# Patient Record
Sex: Male | Born: 1960
Health system: Southern US, Community
[De-identification: ages and names within clinical notes are randomized; demographics above are authoritative.]

## PROBLEM LIST (undated history)

## (undated) DIAGNOSIS — T7840XA Allergy, unspecified, initial encounter: Secondary | ICD-10-CM

## (undated) DIAGNOSIS — I480 Paroxysmal atrial fibrillation: Secondary | ICD-10-CM

## (undated) HISTORY — PX: FOOT SURGERY: SHX648

## (undated) HISTORY — PX: COLONOSCOPY: SHX174

## (undated) HISTORY — DX: Allergy, unspecified, initial encounter: T78.40XA

## (undated) HISTORY — DX: Paroxysmal atrial fibrillation: I48.0

## (undated) HISTORY — PX: APPENDECTOMY: SHX54

---

## 2001-04-01 ENCOUNTER — Emergency Department (HOSPITAL_COMMUNITY): Admission: EM | Admit: 2001-04-01 | Discharge: 2001-04-01 | Payer: Self-pay | Admitting: Emergency Medicine

## 2010-04-21 ENCOUNTER — Encounter: Payer: Self-pay | Admitting: Internal Medicine

## 2010-04-27 NOTE — Letter (Signed)
Summary: Pre Visit Letter Revised  [facility]  [address] [place], [place] [address]   Phone: [phone]  Fax: [phone]        04/21/2010 MRN: [id number]  Casey Grant [address] [place], [place]  [address]             Procedure Date:  06-11-10 11:30am            Dr Marina Goodell    Direct Colon   Welcome to the Gastroenterology Division at [facility].    You are scheduled to see a nurse for your pre-procedure visit on 05-24-10  at 4pm on the 3rd floor at [facility], [address]  We ask that you try to arrive at our office 15 minutes prior to your appointment time to allow for check-in.  Please take a minute to review the attached form.  If you answer "Yes" to one or more of the questions on the first page, we ask that you call the person listed at your earliest opportunity.  If you answer "No" to all of the questions, please complete the rest of the form and bring it to your appointment.    Your nurse visit will consist of discussing your medical and surgical history, your immediate family medical history, and your medications.   If you are unable to list all of your medications on the form, please bring the medication bottles to your appointment and we will list them.  We will need to be aware of both prescribed and over the counter drugs.  We will need to know exact dosage information as well.    Please be prepared to read and sign documents such as consent forms, a financial agreement, and acknowledgement forms.  If necessary, and with your consent, a friend or relative is welcome to sit-in on the nurse visit with you.  Please bring your insurance card so that we may make a copy of it.  If your insurance requires a referral to see a specialist, please bring your referral form from your primary care physician.  No co-pay is required for this nurse visit.     If you cannot keep your appointment, please call [phone] to cancel or reschedule prior  to your appointment date.  This allows [place] the opportunity to schedule an appointment for another patient in need of care.    Thank you for choosing [facility] for your medical needs.  We appreciate the opportunity to care for you.  Please visit [place] at our website  to learn more about our practice.  Sincerely, The Gastroenterology Division

## 2010-05-24 ENCOUNTER — Ambulatory Visit (AMBULATORY_SURGERY_CENTER): Payer: 59 | Admitting: *Deleted

## 2010-05-24 VITALS — Ht 71.0 in | Wt 175.2 lb

## 2010-05-24 DIAGNOSIS — Z1211 Encounter for screening for malignant neoplasm of colon: Secondary | ICD-10-CM

## 2010-05-24 MED ORDER — PEG-KCL-NACL-NASULF-NA ASC-C 100 G PO SOLR: ORAL | Status: DC

## 2010-06-11 ENCOUNTER — Other Ambulatory Visit: Payer: Self-pay | Admitting: Internal Medicine

## 2010-06-25 ENCOUNTER — Other Ambulatory Visit: Payer: 59 | Admitting: Internal Medicine

## 2010-07-02 ENCOUNTER — Encounter: Payer: Self-pay | Admitting: Internal Medicine

## 2010-07-02 ENCOUNTER — Ambulatory Visit (AMBULATORY_SURGERY_CENTER): Payer: 59 | Admitting: Internal Medicine

## 2010-07-02 VITALS — BP 114/65 | HR 40 | Temp 96.5°F | Resp 12 | Ht 71.0 in | Wt 168.0 lb

## 2010-07-02 DIAGNOSIS — Z1211 Encounter for screening for malignant neoplasm of colon: Secondary | ICD-10-CM

## 2010-07-02 MED ORDER — SODIUM CHLORIDE 0.9 % IV SOLN: 500.0000 mL | INTRAVENOUS | Status: AC

## 2010-07-02 NOTE — Patient Instructions (Signed)
Discharged instructions given with verbal understanding. Normal examination. Resume previous medications. 

## 2010-07-05 ENCOUNTER — Telehealth: Payer: Self-pay | Admitting: *Deleted

## 2010-07-05 NOTE — Telephone Encounter (Signed)
Unable to leave message due to no identification on the cell number/ home number left-[id number] [id number]

## 2017-03-16 ENCOUNTER — Ambulatory Visit: Payer: 59 | Admitting: Physician Assistant

## 2017-03-16 ENCOUNTER — Encounter: Payer: Self-pay | Admitting: Physician Assistant

## 2017-03-16 VITALS — BP 116/76 | HR 51 | Temp 97.3°F | Resp 16 | Ht 71.0 in | Wt 176.0 lb

## 2017-03-16 DIAGNOSIS — L089 Local infection of the skin and subcutaneous tissue, unspecified: Secondary | ICD-10-CM | POA: Diagnosis not present

## 2017-03-16 MED ORDER — CEPHALEXIN 500 MG PO CAPS: 500.0000 mg | ORAL_CAPSULE | Freq: Three times a day (TID) | ORAL | 0 refills | Status: AC

## 2017-03-16 NOTE — Progress Notes (Signed)
PRIMARY CARE AT [facility] [address] [place] [address] [phone]  Date:  03/16/2017   Name:  ZELL HYLTON   DOB:  06/08/1960   MRN:  [id number]  PCP:  Jacqulyn Cane, MD    History of Present Illness:  JAHAD OLD is a 57 y.o. male patient who presents to PCP with  Chief Complaint  Patient presents with  . bump on finger    possible infection/ x 1 month, puus and blood     Patient is here with bump on his finger for 1 month.  He has had 2 occasions where he has attempted to drain the lesion.  He reports that pus came out of the lesion.  The last time he did this was yesterday.  He notes no fever.  No trouble with using the finger.  He is right hand dominant.    There are no active problems to display for this patient.   Past Medical History:  Diagnosis Date  . Allergy     Past Surgical History:  Procedure Laterality Date  . APPENDECTOMY      Social History   Tobacco Use  . Smoking status: Never Smoker  Substance Use Topics  . Alcohol use: No  . Drug use: No    History reviewed. No pertinent family history.  No Known Allergies  Medication list has been reviewed and updated.  Current Outpatient Medications on File Prior to Visit  Medication Sig Dispense Refill  . peg 3350 powder (MOVIPREP) 100 G SOLR MOVIPREP AS DIRECTED (Patient not taking: Reported on 03/16/2017) 1 kit 0   Current Facility-Administered Medications on File Prior to Visit  Medication Dose Route Frequency Provider Last Rate Last Dose  . 0.9 %  sodium chloride infusion  500 mL Intravenous Continuous Irene Shipper, MD        ROS ROS otherwise unremarkable unless listed above.  Physical Examination: BP 116/76   Pulse (!) 51   Temp (!) 97.3 F (36.3 C) (Oral)   Resp 16   Ht '5\' 11"'  (1.803 m)   Wt 176 lb (79.8 kg)   SpO2 100%   BMI 24.55 kg/m  Ideal Body Weight: Weight in (lb) to have BMI = 25: 178.9  Physical Exam  Constitutional: He is oriented to person, place, and time.  He appears well-developed and well-nourished. No distress.  HENT:  Head: Normocephalic and atraumatic.  Eyes: Conjunctivae and EOM are normal. Pupils are equal, round, and reactive to light.  Cardiovascular: Normal rate.  Pulmonary/Chest: Effort normal. No respiratory distress.  Neurological: He is alert and oriented to person, place, and time.  Skin: Skin is warm and dry. Capillary refill takes less than 2 seconds. He is not diaphoretic.  Dorsum of 5th finger distally, is a mildly tender nodule with abrasion puncture marks consistent with his report of draining lesion.   Psychiatric: He has a normal mood and affect. His behavior is normal.   Procedure: verbal consent obtained.  Cleansed with povidine.  1% lidocaine placed at the localized lesion of 5th finger dorsum of distal area.  11 blade utilized to make simple incision.  No drainage removed at this time, but sanguinous material.  Searched with 2 small .3cm incisions.  Cleansed with saline and dressing placed.  Assessment and Plan: DERRIK MCEACHERN is a 57 y.o. male who is here today for cc of  Chief Complaint  Patient presents with  . bump on finger    possible infection/ x 1  month, puus and blood  keflex given today.  Advised alarming symptoms to warrant immediate return.   Tetanus utd.  Wound care discussed.  Infection of finger - Plan: cephALEXin (KEFLEX) 500 MG capsule  Ivar Drape, PA-C [facility] 1/31/20192:54 PM

## 2017-03-16 NOTE — Patient Instructions (Addendum)
Please soak the finger three times per day for 15 minutes.  This can be warm soaks in water, or with soapy water, or 1/3 of hydrogen peroxide WITH the water.   Keep bandaged. Do not submerge the finger in soiled water.  Do not mess with debris or dirt with the finger.  If you do, immediate wash this.     IF you received an x-ray today, you will receive an invoice from [facility]. Please contact [facility] at [phone] with questions or concerns regarding your invoice.   IF you received labwork today, you will receive an invoice from [facility]. Please contact [facility] at [phone] with questions or concerns regarding your invoice.   Our billing staff will not be able to assist you with questions regarding bills from these companies.  You will be contacted with the lab results as soon as they are available. The fastest way to get your results is to activate your My Chart account. Instructions are located on the last page of this paperwork. If you have not heard from [place] regarding the results in 2 weeks, please contact this office.

## 2017-05-15 ENCOUNTER — Encounter: Payer: Self-pay | Admitting: Physician Assistant

## 2017-08-30 ENCOUNTER — Ambulatory Visit: Payer: 59 | Admitting: Physician Assistant

## 2017-08-30 ENCOUNTER — Other Ambulatory Visit: Payer: Self-pay

## 2017-08-30 ENCOUNTER — Encounter: Payer: Self-pay | Admitting: Physician Assistant

## 2017-08-30 VITALS — BP 143/79 | HR 50 | Temp 97.4°F | Ht 69.96 in | Wt 170.8 lb

## 2017-08-30 DIAGNOSIS — Z23 Encounter for immunization: Secondary | ICD-10-CM

## 2017-08-30 DIAGNOSIS — S61412A Laceration without foreign body of left hand, initial encounter: Secondary | ICD-10-CM | POA: Diagnosis not present

## 2017-08-30 NOTE — Progress Notes (Signed)
  08/30/2017 12:15 PM   DOB: 29-Oct-1960 / MRN: [id number]  SUBJECTIVE:  Casey Grant is a 57 y.o. male presenting for a laceration that occurred last night. Denies loss of strength, change in function and sensation. Washed the wound and bandaged.  Works at the [employer].  No allergies. Can't remember last TD.    There is no immunization history for the selected administration types on file for this patient.  He has No Known Allergies.   Review of Systems  Constitutional: Negative for chills, diaphoresis and fever.  Gastrointestinal: Negative for nausea.  Skin: Negative for rash.  Neurological: Negative for dizziness, sensory change and focal weakness.  Endo/Heme/Allergies: Does not bruise/bleed easily.    The problem list and medications were reviewed and updated by myself where necessary and exist elsewhere in the encounter.   OBJECTIVE:  BP (!) 143/79 (BP Location: Right Arm, Patient Position: Sitting, Cuff Size: Normal)   Pulse (!) 50   Temp (!) 97.4 F (36.3 C) (Oral)   Ht 5' 9.96" (1.777 m)   Wt 170 lb 12.8 oz (77.5 kg)   SpO2 100%   BMI 24.53 kg/m   Physical Exam  Constitutional: He is oriented to person, place, and time. He appears well-developed. He does not appear ill.  Eyes: Pupils are equal, round, and reactive to light. Conjunctivae and EOM are normal.  Cardiovascular: Normal rate.  Pulmonary/Chest: Effort normal.  Abdominal: He exhibits no distension.  Musculoskeletal: Normal range of motion.       Hands: Neurological: He is alert and oriented to person, place, and time. No cranial nerve deficit. Coordination normal.  Skin: Skin is warm and dry. He is not diaphoretic.  Psychiatric: He has a normal mood and affect.  Nursing note and vitals reviewed.   Risk and benefits discussed and verbal consent obtained. Anesthetic allergies reviewed. Patient anesthetized using 1:1 mix of 2% lidocaine with epi and Marcaine. The wound was cleansed thoroughly with  soap and water. Sterile prep and drape. Wound closed with 5 HM throws using 5-0 Ethilon suture material. Hemostasis achieved. Mupirocin applied to the wound and bandage placed. The patient tolerated well. Wound instructions were provided and the patient is to return in 10 days for suture removal.   ASSESSMENT AND PLAN:  Casey Grant was seen today for laceration.  Diagnoses and all orders for this visit:  Laceration of left hand without foreign body, initial encounter  Need for tetanus booster -     Td vaccine greater than or equal to 7yo preservative free IM    The patient is advised to call or return to clinic if he does not see an improvement in symptoms, or to seek the care of the closest emergency department if he worsens with the above plan.   Philis Fendt, MHS, PA-C [facility] 08/30/2017 12:15 PM

## 2017-08-30 NOTE — Patient Instructions (Addendum)
WOUND CARE Please return in 10 days to have your stitches/staples removed or sooner if you have concerns. . Keep area clean and dry for 24 hours. Do not remove bandage, if applied. . After 24 hours, remove bandage and wash wound gently with mild soap and warm water. Reapply a new bandage after cleaning wound, if directed. . Continue daily cleansing with soap and water until stitches/staples are removed. . Do not apply any ointments or creams to the wound while stitches/staples are in place, as this may cause delayed healing. . Notify the office if you experience any of the following signs of infection: Swelling, redness, pus drainage, streaking, fever >101.0 F . Notify the office if you experience excessive bleeding that does not stop after 15-20 minutes of constant, firm pressure.      IF you received an x-ray today, you will receive an invoice from [facility]. Please contact [facility] at [phone] with questions or concerns regarding your invoice.   IF you received labwork today, you will receive an invoice from [facility]. Please contact [facility] at [phone] with questions or concerns regarding your invoice.   Our billing staff will not be able to assist you with questions regarding bills from these companies.  You will be contacted with the lab results as soon as they are available. The fastest way to get your results is to activate your My Chart account. Instructions are located on the last page of this paperwork. If you have not heard from us regarding the results in 2 weeks, please contact this office.      

## 2017-09-11 ENCOUNTER — Ambulatory Visit: Payer: 59 | Admitting: Physician Assistant

## 2017-09-11 ENCOUNTER — Encounter: Payer: Self-pay | Admitting: Physician Assistant

## 2017-09-11 ENCOUNTER — Other Ambulatory Visit: Payer: Self-pay

## 2017-09-11 VITALS — BP 129/76 | HR 44 | Temp 97.4°F | Resp 16 | Ht 69.96 in | Wt 169.8 lb

## 2017-09-11 DIAGNOSIS — S61412A Laceration without foreign body of left hand, initial encounter: Secondary | ICD-10-CM

## 2017-09-11 NOTE — Patient Instructions (Signed)
     IF you received an x-ray today, you will receive an invoice from [facility]. Please contact [facility] at [phone] with questions or concerns regarding your invoice.   IF you received labwork today, you will receive an invoice from [facility]. Please contact [facility] at [phone] with questions or concerns regarding your invoice.   Our billing staff will not be able to assist you with questions regarding bills from these companies.  You will be contacted with the lab results as soon as they are available. The fastest way to get your results is to activate your My Chart account. Instructions are located on the last page of this paperwork. If you have not heard from us regarding the results in 2 weeks, please contact this office.     

## 2017-09-11 NOTE — Progress Notes (Signed)
4 sutures removed from the left hand without difficulty.  No complications.  No charge visit.

## 2019-03-01 ENCOUNTER — Other Ambulatory Visit: Payer: Self-pay | Admitting: Nurse Practitioner

## 2019-03-01 ENCOUNTER — Ambulatory Visit
Admission: RE | Admit: 2019-03-01 | Discharge: 2019-03-01 | Disposition: A | Discharge status: Home / Self Care | Payer: No Typology Code available for payment source | Source: Ambulatory Visit | Attending: Nurse Practitioner | Admitting: Nurse Practitioner

## 2019-03-01 DIAGNOSIS — Z0000 Encounter for general adult medical examination without abnormal findings: Secondary | ICD-10-CM

## 2019-10-30 ENCOUNTER — Other Ambulatory Visit: Payer: Self-pay

## 2019-10-30 ENCOUNTER — Ambulatory Visit: Payer: 59 | Admitting: Podiatry

## 2019-10-30 ENCOUNTER — Ambulatory Visit (INDEPENDENT_AMBULATORY_CARE_PROVIDER_SITE_OTHER): Payer: 59

## 2019-10-30 DIAGNOSIS — M25871 Other specified joint disorders, right ankle and foot: Secondary | ICD-10-CM | POA: Diagnosis not present

## 2019-10-30 DIAGNOSIS — M2041 Other hammer toe(s) (acquired), right foot: Secondary | ICD-10-CM | POA: Diagnosis not present

## 2019-10-30 DIAGNOSIS — S9031XA Contusion of right foot, initial encounter: Secondary | ICD-10-CM | POA: Diagnosis not present

## 2019-10-30 DIAGNOSIS — Z01818 Encounter for other preprocedural examination: Secondary | ICD-10-CM

## 2019-10-30 DIAGNOSIS — M79671 Pain in right foot: Secondary | ICD-10-CM | POA: Diagnosis not present

## 2019-10-31 ENCOUNTER — Other Ambulatory Visit: Payer: Self-pay | Admitting: Podiatry

## 2019-10-31 DIAGNOSIS — S9031XA Contusion of right foot, initial encounter: Secondary | ICD-10-CM

## 2019-11-01 ENCOUNTER — Encounter: Payer: Self-pay | Admitting: Podiatry

## 2019-11-01 NOTE — Progress Notes (Signed)
Subjective:  Patient ID: Casey Grant, male    DOB: 1960-03-12,  MRN: [id number]  Chief Complaint  Patient presents with  . Foot Pain    Right foot pain PT stated that he has some swelling underneath his toes, He has been to DR.TOMS and got a injection which helped for a few months but pain came back    59 y.o. male presents with the above complaint.  Patient presents with complaint of right second metatarsophalangeal joint pain.  Patient states that he has been treated with conservative treatment options by Dr. Gershon Mussel in which he got injections multiple times which did tend to help but his pain is coming back more often.  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  He has tried all the conservative treatment options but has failed them all.  He would like to discuss treatment with surgical treatment options at this time.  He is a runner and he would like to continue running.  Patient states that the right second metatarsophalangeal joint like it sticks locating on the MPJ with every step.  He can feel the crepitus with pain.  He denies any other acute complaints.  He would like to discuss surgical treatment options at this time.   Review of Systems: Negative except as noted in the HPI. Denies N/V/F/Ch.  Past Medical History:  Diagnosis Date  . Allergy     Current Outpatient Medications:  .  peg 3350 powder (MOVIPREP) 100 G SOLR, MOVIPREP AS DIRECTED (Patient not taking: Reported on 03/16/2017), Disp: 1 kit, Rfl: 0  Current Facility-Administered Medications:  .  0.9 %  sodium chloride infusion, 500 mL, Intravenous, Continuous, Irene Shipper, MD  Social History   Tobacco Use  Smoking Status Never Smoker  Smokeless Tobacco Never Used    No Known Allergies Objective:  There were no vitals filed for this visit. There is no height or weight on file to calculate BMI. Constitutional Well developed. Well nourished.  Vascular Dorsalis pedis pulses palpable bilaterally. Posterior tibial  pulses palpable bilaterally. Capillary refill normal to all digits.  No cyanosis or clubbing noted. Pedal hair growth normal.  Neurologic Normal speech. Oriented to person, place, and time. Epicritic sensation to light touch grossly present bilaterally.  Dermatologic Nails well groomed and normal in appearance. No open wounds. No skin lesions.  Orthopedic:  Pain on palpation to the right second metatarsophalangeal joint.  Keller completion of stress test shows dislocation of the second MPJ on the metatarsal.  Patient likely has plantar plate injury.  Predislocation of the right second digit noted.  Hammertoe contracture semiflexible in nature to the right second metatarsophalangeal joint noted.   Radiographs: 3 views of skeletally mature adult right foot: The second metatarsophalangeal joint appears to be dislocated with second digit dorsally on the metatarsal head making more plantarflexed.  Hammertoe contracture of the second digit noted as well.  No other bony abnormalities identified. Assessment:   1. Foot pain, right   2. 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 Hammertoe of second toe of right foot   4. Preoperative examination    Plan:  Patient was evaluated and treated and all questions answered.  Right second metatarsophalangeal joint predislocation syndrome with underlying hammertoe contracture -I explained to patient the etiology of predislocation syndrome and various treatment options were discussed.  I believe this is likely has to do with the plantar plate attenuation/rupture leading to dorsal dislocation of the second digit on the metatarsophalangeal joint.  I discussed this with patient in extensive detail.  I believe he will benefit from a surgical intervention to correct the hammertoe as well as possibly fix the plantar plate.  I discussed with the patient that he will have a dorsal incision as well as plantar incision to address it.  I also  discussed with him that the healing time is longer with plantar incision.  Patient states understanding.  He will be nonweightbearing with a cam boot followed by transition to weightbearing as tolerated surgical shoe into regular sneakers.  Healing time will be about 6 to 8 weeks.  Patient states understanding.  I plan on performing a right second metatarsal phalangeal joint capsulotomy with hammertoe correction with a possible plantar plate repair with a plantar incision. -Informed surgical risk consent was reviewed and read aloud to the patient.  I reviewed the films.  I have discussed my findings with the patient in great detail.  I have discussed all risks including but not limited to infection, stiffness, scarring, limp, disability, deformity, damage to blood vessels and nerves, numbness, poor healing, need for braces, arthritis, chronic pain, amputation, death.  All benefits and realistic expectations discussed in great detail.  I have made no promises as to the outcome.  I have provided realistic expectations.  I have offered the patient a 2nd opinion, which they have declined and assured me they preferred to proceed despite the risks -A total of 45 minutes was spent in direct patient care as well as pre and post patient encounter activities.  This includes documentation as well as reviewing patient chart for labs, imaging, past medical, surgical, social, and family history as documented in the EMR.  I have reviewed medication allergies as documented in EMR.  I discussed the etiology of condition and treatment options from conservative to surgical care.  All risks and benefit of the treatment course was discussed in detail.  All questions were answered and return appointment was discussed.  Since the visit completed in an ambulatory/outpatient setting, the patient and/or parent/guardian has been advised to contact the providers office for worsening condition and seek medical treatment and/or call [phone] if the  patient deems either is necessary.  No follow-ups on file.

## 2019-11-28 DIAGNOSIS — M79676 Pain in unspecified toe(s): Secondary | ICD-10-CM

## 2019-11-29 ENCOUNTER — Telehealth: Payer: Self-pay

## 2019-11-29 NOTE — Telephone Encounter (Signed)
DOS 12/16/2019  METATARSAL OSTEOTOMY 2ND RT - 28308 CAPSULOTOMY, MPJ RELEASE JOINT 2ND RT - 28270 HAMMERTOE REPAIR  2ND RT - 28285  [facility] EFFECTIVE DATE - 03/20/2019  PLAN DEDUCTIBLE - $500.00 W/ $294.43 REMAINING OUT OF POCKET - $3000.00 W/ $2362.84 REMAINING COPAY $0.00 COINSURANCE - 20%  SPOKE TO [place] REF # [id number]  NOTIFICATION/PRIOR AUTHORIZATION NUMBER CASE STATUS CASE STATUS REASON PRIMARY CARE PHYSICIAN [id number] Closed Case Was Managed And Is Now Complete - ADVANCE NOTIFY DATE/TIME ADMISSION NOTIFY DATE/TIME 11/27/2019 09:57 AM CDT - COVERAGE STATUS OVERALL COVERAGE STATUS Covered/Approved 1-3 CODE DESCRIPTION COVERAGE STATUS DECISION DATE FAC [facility] Coverage determination is reflected for the facility admission and is not a guarantee of payment for ongoing services. Covered/Approved 11/27/2019 1 28285 Correction, hammertoe (eg, interphalange more Covered/Approved 11/27/2019 2 28270 Capsulotomy; metatarsophalangeal joint, more Covered/Approved 11/27/2019 3 28308 Osteotomy, with or without lengthening, more Covered/Approved 11/27/2019

## 2019-12-16 ENCOUNTER — Telehealth: Payer: Self-pay | Admitting: Podiatry

## 2019-12-16 DIAGNOSIS — M2041 Other hammer toe(s) (acquired), right foot: Secondary | ICD-10-CM | POA: Diagnosis not present

## 2019-12-16 MED ORDER — OXYCODONE-ACETAMINOPHEN 10-325 MG PO TABS
1.0000 | ORAL_TABLET | ORAL | 0 refills | Status: DC | PRN
Start: 2019-12-16 — End: 2020-08-12

## 2019-12-16 MED ORDER — OXYCODONE-ACETAMINOPHEN 10-325 MG PO TABS
1.0000 | ORAL_TABLET | Freq: Four times a day (QID) | ORAL | 0 refills | Status: AC | PRN
Start: 2019-12-16 — End: 2019-12-24

## 2019-12-16 MED ORDER — IBUPROFEN 800 MG PO TABS: 800.0000 mg | ORAL_TABLET | Freq: Four times a day (QID) | ORAL | 1 refills | Status: DC | PRN

## 2019-12-16 NOTE — Addendum Note (Signed)
Addended by: Boneta Lucks on: 12/16/2019 07:18 AM   Modules accepted: Orders

## 2019-12-16 NOTE — Telephone Encounter (Signed)
Patients wife called back regarding medication timing clarification. He is starting to experience pain.

## 2019-12-16 NOTE — Addendum Note (Signed)
Addended by: Boneta Lucks on: 12/16/2019 08:45 AM   Modules accepted: Orders

## 2019-12-16 NOTE — Telephone Encounter (Signed)
Pt's wife called stating that Casey Grant was unsure of how much medication should be taken a day. Please advise.

## 2019-12-16 NOTE — Telephone Encounter (Signed)
Forwarded to Dr. Posey Pronto as he is his patient

## 2019-12-17 NOTE — Telephone Encounter (Signed)
He can take them in staggered fashion so take the percocet first then 2 hours later ibuprofen then two hours later percocet.

## 2019-12-23 ENCOUNTER — Encounter: Payer: 59 | Admitting: Podiatry

## 2019-12-25 ENCOUNTER — Encounter: Payer: Self-pay | Admitting: Podiatry

## 2019-12-25 ENCOUNTER — Other Ambulatory Visit: Payer: Self-pay

## 2019-12-25 ENCOUNTER — Other Ambulatory Visit: Payer: Self-pay | Admitting: Podiatry

## 2019-12-25 ENCOUNTER — Ambulatory Visit (INDEPENDENT_AMBULATORY_CARE_PROVIDER_SITE_OTHER): Payer: 59

## 2019-12-25 ENCOUNTER — Ambulatory Visit (INDEPENDENT_AMBULATORY_CARE_PROVIDER_SITE_OTHER): Payer: 59 | Admitting: Podiatry

## 2019-12-25 DIAGNOSIS — Z9889 Other specified postprocedural states: Secondary | ICD-10-CM

## 2019-12-25 DIAGNOSIS — M25871 Other specified joint disorders, right ankle and foot: Secondary | ICD-10-CM

## 2019-12-25 DIAGNOSIS — M86171 Other acute osteomyelitis, right ankle and foot: Secondary | ICD-10-CM

## 2019-12-25 DIAGNOSIS — M2041 Other hammer toe(s) (acquired), right foot: Secondary | ICD-10-CM

## 2019-12-25 NOTE — Progress Notes (Signed)
  Subjective:  Patient ID: Casey Grant, male    DOB: 05-04-1960,  MRN: [id number]  Chief Complaint  Patient presents with  . Routine Post Op    POV #1 DOS 12/16/2019 METATARSAL OSTEOTOMY 2ND RT; CAPSULOTOMY MPJ RELEASE JOINT 2ND RT; HAMMERTOE REPAIR 2ND RT    59 y.o. male returns for post-op check. Patient is doing well. No acute complaints. He has not been taking any pain medication as well. His pain is well controlled. Is ambulating with a cam boot on.  Review of Systems: Negative except as noted in the HPI. Denies N/V/F/Ch.  Past Medical History:  Diagnosis Date  . Allergy     Current Outpatient Medications:  .  ibuprofen (ADVIL) 800 MG tablet, Take 1 tablet (800 mg total) by mouth every 6 (six) hours as needed., Disp: 60 tablet, Rfl: 1 .  ibuprofen (ADVIL) 800 MG tablet, Take 1 tablet (800 mg total) by mouth every 6 (six) hours as needed., Disp: 60 tablet, Rfl: 1 .  oxyCODONE-acetaminophen (PERCOCET) 10-325 MG tablet, Take 1 tablet by mouth every 4 (four) hours as needed for pain., Disp: 30 tablet, Rfl: 0 .  peg 3350 powder (MOVIPREP) 100 G SOLR, MOVIPREP AS DIRECTED, Disp: 1 kit, Rfl: 0  Current Facility-Administered Medications:  .  0.9 %  sodium chloride infusion, 500 mL, Intravenous, Continuous, Irene Shipper, MD  Social History   Tobacco Use  Smoking Status Never Smoker  Smokeless Tobacco Never Used    No Known Allergies Objective:  There were no vitals filed for this visit. There is no height or weight on file to calculate BMI. Constitutional Well developed. Well nourished.  Vascular Foot warm and well perfused. Capillary refill normal to all digits.   Neurologic Normal speech. Oriented to person, place, and time. Epicritic sensation to light touch grossly present bilaterally.  Dermatologic Skin healing well without signs of infection. Skin edges well coapted without signs of infection.  Orthopedic: Tenderness to palpation noted about the surgical site.    Radiographs: 3 views of skeletally mature the right foot: Good correction alignment noted. Hardware is intact. No signs of loosening or backing out of the pin noted Assessment:   1. 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 Hammertoe of second toe of right foot   3. Status post foot surgery    Plan:  Patient was evaluated and treated and all questions answered.  S/p foot surgery right -Progressing as expected post-operatively. -XR: See above -WB Status: Weightbearing as tolerated in surgical shoe -Sutures: Intact. No signs of dehiscence noted. No complication noted. -Medications: None -Foot redressed.  No follow-ups on file.

## 2020-01-01 ENCOUNTER — Encounter: Payer: 59 | Admitting: Podiatry

## 2020-01-15 ENCOUNTER — Ambulatory Visit (INDEPENDENT_AMBULATORY_CARE_PROVIDER_SITE_OTHER): Payer: 59 | Admitting: Podiatry

## 2020-01-15 ENCOUNTER — Ambulatory Visit (INDEPENDENT_AMBULATORY_CARE_PROVIDER_SITE_OTHER): Payer: 59

## 2020-01-15 ENCOUNTER — Other Ambulatory Visit: Payer: Self-pay

## 2020-01-15 DIAGNOSIS — M2041 Other hammer toe(s) (acquired), right foot: Secondary | ICD-10-CM | POA: Diagnosis not present

## 2020-01-15 DIAGNOSIS — Z9889 Other specified postprocedural states: Secondary | ICD-10-CM | POA: Diagnosis not present

## 2020-01-16 ENCOUNTER — Encounter: Payer: Self-pay | Admitting: Podiatry

## 2020-01-16 NOTE — Progress Notes (Signed)
  Subjective:  Patient ID: Casey Grant, male    DOB: [birth year],  MRN: [id number]  Chief Complaint  Patient presents with  . Routine Post Op    POV #2 DOS 12/16/2019 METATARSAL OSTEOTOMY 2ND RT; CAPSULOTOMY MPJ RELEASE JOINT 2ND RT; HAMMERTOE REPAIR 2ND RT    59 y.o. male returns for post-op check. Patient is doing well. No acute complaints. He has not been taking any pain medication as well. His pain is well controlled. Is ambulating with a cam boot on.  Review of Systems: Negative except as noted in the HPI. Denies N/V/F/Ch.  Past Medical History:  Diagnosis Date  . Allergy     Current Outpatient Medications:  .  ibuprofen (ADVIL) 800 MG tablet, Take 1 tablet (800 mg total) by mouth every 6 (six) hours as needed., Disp: 60 tablet, Rfl: 1 .  ibuprofen (ADVIL) 800 MG tablet, Take 1 tablet (800 mg total) by mouth every 6 (six) hours as needed., Disp: 60 tablet, Rfl: 1 .  oxyCODONE-acetaminophen (PERCOCET) 10-325 MG tablet, Take 1 tablet by mouth every 4 (four) hours as needed for pain., Disp: 30 tablet, Rfl: 0 .  peg 3350 powder (MOVIPREP) 100 G SOLR, MOVIPREP AS DIRECTED, Disp: 1 kit, Rfl: 0  Current Facility-Administered Medications:  .  0.9 %  sodium chloride infusion, 500 mL, Intravenous, Continuous, Irene Shipper, MD  Social History   Tobacco Use  Smoking Status Never Smoker  Smokeless Tobacco Never Used    No Known Allergies Objective:  There were no vitals filed for this visit. There is no height or weight on file to calculate BMI. Constitutional Well developed. Well nourished.  Vascular Foot warm and well perfused. Capillary refill normal to all digits.   Neurologic Normal speech. Oriented to person, place, and time. Epicritic sensation to light touch grossly present bilaterally.  Dermatologic  skin has completely epithelialized without any signs of dehiscence or infection.  Orthopedic:  No tenderness to palpation noted about the surgical site.   Radiographs: 3  views of skeletally mature the right foot: Good correction alignment noted. Hardware is intact. No signs of loosening or backing out of the pin noted Assessment:   1. Hammertoe of right foot   2. Status post foot surgery    Plan:  Patient was evaluated and treated and all questions answered.  S/p foot surgery right -Progressing as expected post-operatively. -XR: See above -WB Status: Weightbearing as tolerated in surgical shoe -Sutures: Stitches were removed.. No signs of dehiscence noted. No complication noted.  I will plan on taking the pin out during next clinical visit -Medications: None -Foot redressed.  No follow-ups on file.

## 2020-01-29 ENCOUNTER — Other Ambulatory Visit: Payer: Self-pay

## 2020-01-29 ENCOUNTER — Other Ambulatory Visit: Payer: Self-pay | Admitting: Podiatry

## 2020-01-29 ENCOUNTER — Encounter: Payer: Self-pay | Admitting: Podiatry

## 2020-01-29 ENCOUNTER — Ambulatory Visit (INDEPENDENT_AMBULATORY_CARE_PROVIDER_SITE_OTHER): Payer: 59

## 2020-01-29 ENCOUNTER — Ambulatory Visit (INDEPENDENT_AMBULATORY_CARE_PROVIDER_SITE_OTHER): Payer: 59 | Admitting: Podiatry

## 2020-01-29 DIAGNOSIS — M2041 Other hammer toe(s) (acquired), right foot: Secondary | ICD-10-CM | POA: Diagnosis not present

## 2020-01-29 DIAGNOSIS — Z9889 Other specified postprocedural states: Secondary | ICD-10-CM

## 2020-01-29 NOTE — Progress Notes (Signed)
  Subjective:  Patient ID: Casey Grant, male    DOB: 12-24-1960,  MRN: [id number]  Chief Complaint  Patient presents with  . Routine Post Op    Pt stated that he is doing well he does have some pain around the pin but has no major concerns .    59 y.o. male returns for post-op check. Patient is doing well. No acute complaints. He has not been taking any pain medication as well. His pain is well controlled. Is ambulating with a cam boot on.  Review of Systems: Negative except as noted in the HPI. Denies N/V/F/Ch.  Past Medical History:  Diagnosis Date  . Allergy     Current Outpatient Medications:  .  ibuprofen (ADVIL) 800 MG tablet, Take 1 tablet (800 mg total) by mouth every 6 (six) hours as needed., Disp: 60 tablet, Rfl: 1 .  ibuprofen (ADVIL) 800 MG tablet, Take 1 tablet (800 mg total) by mouth every 6 (six) hours as needed., Disp: 60 tablet, Rfl: 1 .  oxyCODONE-acetaminophen (PERCOCET) 10-325 MG tablet, Take 1 tablet by mouth every 4 (four) hours as needed for pain., Disp: 30 tablet, Rfl: 0 .  peg 3350 powder (MOVIPREP) 100 G SOLR, MOVIPREP AS DIRECTED, Disp: 1 kit, Rfl: 0  Current Facility-Administered Medications:  .  0.9 %  sodium chloride infusion, 500 mL, Intravenous, Continuous, Irene Shipper, MD  Social History   Tobacco Use  Smoking Status Never Smoker  Smokeless Tobacco Never Used    No Known Allergies Objective:  There were no vitals filed for this visit. There is no height or weight on file to calculate BMI. Constitutional Well developed. Well nourished.  Vascular Foot warm and well perfused. Capillary refill normal to all digits.   Neurologic Normal speech. Oriented to person, place, and time. Epicritic sensation to light touch grossly present bilaterally.  Dermatologic  skin has completely epithelialized without any signs of dehiscence or infection.  Pin removed.  No dislocation of the second metatarsophalangeal joint noted at this time.  Orthopedic:   No tenderness to palpation noted about the surgical site.   Radiographs: 3 views of skeletally mature the right foot: Good correction alignment noted. Hardware is intact. No signs of loosening or backing out of the pin noted Assessment:   1. Status post foot surgery   2. Hammertoe of right foot    Plan:  Patient was evaluated and treated and all questions answered.  S/p foot surgery right -Progressing as expected post-operatively. -XR: See above -WB Status: Weightbearing as tolerated in surgical shoe and begin transition to regular sneakers. -Sutures: None pin was removed at bedside -Medications: None -Foot redressed.  No follow-ups on file.

## 2020-02-06 ENCOUNTER — Telehealth: Payer: Self-pay | Admitting: Podiatry

## 2020-02-06 NOTE — Telephone Encounter (Signed)
Pt states his foot (mostly toes) swell at night when he lays down and would like to know if that's normal, or if he needs to be seen. Please advise.

## 2020-02-06 NOTE — Telephone Encounter (Signed)
That is completely fine as swelling and tingling and numbness is one of the last things to go away.

## 2020-02-26 ENCOUNTER — Ambulatory Visit (INDEPENDENT_AMBULATORY_CARE_PROVIDER_SITE_OTHER): Payer: 59 | Admitting: Podiatry

## 2020-02-26 ENCOUNTER — Encounter: Payer: Self-pay | Admitting: Podiatry

## 2020-02-26 ENCOUNTER — Other Ambulatory Visit: Payer: Self-pay

## 2020-02-26 DIAGNOSIS — Z9889 Other specified postprocedural states: Secondary | ICD-10-CM

## 2020-02-26 DIAGNOSIS — M2041 Other hammer toe(s) (acquired), right foot: Secondary | ICD-10-CM

## 2020-02-26 DIAGNOSIS — M25871 Other specified joint disorders, right ankle and foot: Secondary | ICD-10-CM

## 2020-02-26 NOTE — Progress Notes (Signed)
  Subjective:  Patient ID: Casey Grant, male    DOB: 1960-09-30,  MRN: [id number]  Chief Complaint  Patient presents with  . Routine Post Op    POV#4 -pt states," it's better, pain is less." - 2/10 soreness -w/ numbness at 1st-3rd toes and swelling at midfoot -pt denies redness/N/V/F/Ch Tx: sx shoe and elevation    60 y.o. male returns for post-op check. Patient is doing well. No acute complaints. He has not been taking any pain medication as well. His pain is well controlled. Is ambulating with a cam boot on.  Review of Systems: Negative except as noted in the HPI. Denies N/V/F/Ch.  Past Medical History:  Diagnosis Date  . Allergy     Current Outpatient Medications:  .  ibuprofen (ADVIL) 800 MG tablet, Take 1 tablet (800 mg total) by mouth every 6 (six) hours as needed., Disp: 60 tablet, Rfl: 1 .  ibuprofen (ADVIL) 800 MG tablet, Take 1 tablet (800 mg total) by mouth every 6 (six) hours as needed., Disp: 60 tablet, Rfl: 1 .  oxyCODONE-acetaminophen (PERCOCET) 10-325 MG tablet, Take 1 tablet by mouth every 4 (four) hours as needed for pain., Disp: 30 tablet, Rfl: 0 .  peg 3350 powder (MOVIPREP) 100 G SOLR, MOVIPREP AS DIRECTED, Disp: 1 kit, Rfl: 0  Current Facility-Administered Medications:  .  0.9 %  sodium chloride infusion, 500 mL, Intravenous, Continuous, Irene Shipper, MD  Social History   Tobacco Use  Smoking Status Never Smoker  Smokeless Tobacco Never Used    No Known Allergies Objective:  There were no vitals filed for this visit. There is no height or weight on file to calculate BMI. Constitutional Well developed. Well nourished.  Vascular Foot warm and well perfused. Capillary refill normal to all digits.   Neurologic Normal speech. Oriented to person, place, and time. Epicritic sensation to light touch grossly present bilaterally.  Dermatologic  skin has completely epithelialized without any signs of dehiscence or infection.  Pin removed.  No dislocation of  the second metatarsophalangeal joint noted at this time.  Orthopedic:  No tenderness to palpation noted about the surgical site.   Radiographs: 3 views of skeletally mature the right foot: Good correction alignment noted. Hardware is intact. No signs of loosening or backing out of the pin noted Assessment:   1. Status post foot surgery   2. Hammertoe of right foot   3. 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  S/p foot surgery right -Progressing as expected post-operatively. -XR: See above -WB Status: Weightbearing as tolerated in regular shoes -Sutures: None pin was removed at bedside -Medications: None -At this time clinically patient no longer has dislocation and good correction alignment noted of the digit.  He can return to regular shoes and see if there is any dislocation of the MTPJ joint.  Otherwise I will see him if any other foot and ankle issues arise in the future.  Patient states understanding  No follow-ups on file.

## 2020-03-31 ENCOUNTER — Telehealth: Payer: Self-pay | Admitting: *Deleted

## 2020-03-31 NOTE — Telephone Encounter (Signed)
Sounds good

## 2020-03-31 NOTE — Telephone Encounter (Signed)
Patient is having problems with the right foot, 2nd,3rd toes since surgery on Nov. 1st, released on Jan. 12 th.he is experiencing minor swelling, pain, numbness at times at tip of 2nd toe, seems like popping out of joint from the bottom.   Patient has been scheduled/confirmed  to be seen 04/03/20.

## 2020-04-03 ENCOUNTER — Other Ambulatory Visit: Payer: Self-pay

## 2020-04-03 ENCOUNTER — Ambulatory Visit (INDEPENDENT_AMBULATORY_CARE_PROVIDER_SITE_OTHER): Payer: 59 | Admitting: Podiatry

## 2020-04-03 ENCOUNTER — Encounter: Payer: Self-pay | Admitting: Podiatry

## 2020-04-03 DIAGNOSIS — M792 Neuralgia and neuritis, unspecified: Secondary | ICD-10-CM | POA: Diagnosis not present

## 2020-04-03 NOTE — Progress Notes (Signed)
  Subjective:  Patient ID: Casey Grant, male    DOB: March 26, 1960,  MRN: [id number]  Chief Complaint  Patient presents with  . Routine Post Op    POST OP     60 y.o. male presents with the above complaint.  Patient presents with complaint of numbness tingling to the surgical site where we did the second hammertoe correction with metatarsophalangeal joint capsulotomy date of surgery 12/16/2019.  Patient states that he just wants to get eval make sure that there is nothing else going on there is occasional swelling and sometimes numbness and tingling.  He does not have any pain.  He has been able to ambulate without acute complaints.  He just wants to get eval that this is normal postsurgical healing.   Review of Systems: Negative except as noted in the HPI. Denies N/V/F/Ch.  Past Medical History:  Diagnosis Date  . Allergy     Current Outpatient Medications:  .  ibuprofen (ADVIL) 800 MG tablet, Take 1 tablet (800 mg total) by mouth every 6 (six) hours as needed., Disp: 60 tablet, Rfl: 1 .  ibuprofen (ADVIL) 800 MG tablet, Take 1 tablet (800 mg total) by mouth every 6 (six) hours as needed., Disp: 60 tablet, Rfl: 1 .  oxyCODONE-acetaminophen (PERCOCET) 10-325 MG tablet, Take 1 tablet by mouth every 4 (four) hours as needed for pain., Disp: 30 tablet, Rfl: 0 .  peg 3350 powder (MOVIPREP) 100 G SOLR, MOVIPREP AS DIRECTED, Disp: 1 kit, Rfl: 0  Current Facility-Administered Medications:  .  0.9 %  sodium chloride infusion, 500 mL, Intravenous, Continuous, Irene Shipper, MD  Social History   Tobacco Use  Smoking Status Never Smoker  Smokeless Tobacco Never Used    No Known Allergies Objective:  There were no vitals filed for this visit. There is no height or weight on file to calculate BMI. Constitutional Well developed. Well nourished.  Vascular Dorsalis pedis pulses palpable bilaterally. Posterior tibial pulses palpable bilaterally. Capillary refill normal to all digits.  No  cyanosis or clubbing noted. Pedal hair growth normal.  Neurologic Normal speech. Oriented to person, place, and time. Epicritic sensation to light touch grossly present bilaterally.  Dermatologic  numbness and tingling along the incision site.  No itching associated with it.  No ulceration noted.  Good correction alignment noted of the surgical site as well of the second digit on the metatarsophalangeal joint.  No further dislocation noted.  Orthopedic: Normal joint ROM without pain or crepitus bilaterally. No visible deformities. No bony tenderness.   Radiographs: None Assessment:   1. Neuritis    Plan:  Patient was evaluated and treated and all questions answered.  Neuritis -I explained to patient the etiology of neuritis and various treatment options were discussed.  I discussed with the patient that this is completely common and as expected from the surgical site.  This can take sometimes up to 1 year to resolve.  Patient states understanding and will continue to monitor.  No follow-ups on file.

## 2020-05-25 ENCOUNTER — Encounter: Payer: Self-pay | Admitting: Internal Medicine

## 2020-08-12 ENCOUNTER — Other Ambulatory Visit: Payer: Self-pay

## 2020-08-12 ENCOUNTER — Ambulatory Visit (AMBULATORY_SURGERY_CENTER): Payer: 59 | Admitting: *Deleted

## 2020-08-12 VITALS — Ht 70.0 in | Wt 168.0 lb

## 2020-08-12 DIAGNOSIS — Z1211 Encounter for screening for malignant neoplasm of colon: Secondary | ICD-10-CM

## 2020-08-12 MED ORDER — SUTAB 1479-225-188 MG PO TABS: 24.0000 | ORAL_TABLET | ORAL | 0 refills | Status: DC

## 2020-08-12 NOTE — Progress Notes (Signed)
No egg or soy allergy known to patient  No issues with past sedation with any surgeries or procedures Patient denies ever being told they had issues or difficulty with intubation  No FH of Malignant Hyperthermia No diet pills per patient No home 02 use per patient  No blood thinners per patient  Pt denies issues with constipation  No A fib or A flutter  EMMI video to pt or via [place] 19 guidelines implemented in [place] today with Pt and RN  Pt is fully vaccinated  for [employer] given to pt in [place] today , Code to Pharmacy and  NO PA's for preps discussed with pt In [facility] today  Discussed with pt there will be an out-of-pocket cost for prep and that varies from $0 to 70 dollars   Due to the COVID-19 pandemic we are asking patients to follow certain guidelines.  Pt aware of COVID protocols and [facility] guidelines

## 2020-08-26 ENCOUNTER — Other Ambulatory Visit: Payer: Self-pay

## 2020-08-26 ENCOUNTER — Ambulatory Visit (AMBULATORY_SURGERY_CENTER): Payer: 59 | Admitting: Internal Medicine

## 2020-08-26 ENCOUNTER — Encounter: Payer: Self-pay | Admitting: Internal Medicine

## 2020-08-26 VITALS — BP 126/74 | HR 41 | Temp 97.7°F | Resp 10 | Ht 70.0 in | Wt 168.0 lb

## 2020-08-26 DIAGNOSIS — D125 Benign neoplasm of sigmoid colon: Secondary | ICD-10-CM

## 2020-08-26 DIAGNOSIS — K6389 Other specified diseases of intestine: Secondary | ICD-10-CM

## 2020-08-26 DIAGNOSIS — D122 Benign neoplasm of ascending colon: Secondary | ICD-10-CM

## 2020-08-26 DIAGNOSIS — Z1211 Encounter for screening for malignant neoplasm of colon: Secondary | ICD-10-CM | POA: Diagnosis not present

## 2020-08-26 MED ORDER — SODIUM CHLORIDE 0.9 % IV SOLN: 500.0000 mL | Freq: Once | INTRAVENOUS | Status: DC

## 2020-08-26 NOTE — Progress Notes (Signed)
Pt's states no medical or surgical changes since previsit or office visit. 

## 2020-08-26 NOTE — Op Note (Signed)
[facility] Patient Name: Casey Grant Procedure Date: 08/26/2020 8:49 AM MRN: [id number] Endoscopist: Docia Chuck. Henrene Pastor , MD Age: 60 Referring MD:  Date of Birth: [birth year] Gender: Male Account #: [id number] Procedure:                Colonoscopy with cold snare polypectomy x 3; with                            biopsies Indications:              Screening for colorectal malignant neoplasm.                            Negative index exam May 2012 Medicines:                Monitored Anesthesia Care Procedure:                Pre-Anesthesia Assessment:                           - Prior to the procedure, a History and Physical                            was performed, and patient medications and                            allergies were reviewed. The patient's tolerance of                            previous anesthesia was also reviewed. The risks                            and benefits of the procedure and the sedation                            options and risks were discussed with the patient.                            All questions were answered, and informed consent                            was obtained. Prior Anticoagulants: The patient has                            taken no previous anticoagulant or antiplatelet                            agents. ASA Grade Assessment: II - A patient with                            mild systemic disease. After reviewing the risks                            and benefits, the patient was deemed in  satisfactory condition to undergo the procedure.                           After obtaining informed consent, the colonoscope                            was passed under direct vision. Throughout the                            procedure, the patient's blood pressure, pulse, and                            oxygen saturations were monitored continuously. The                            CF [id number] #[id number] was introduced through  the anus                            and advanced to the the cecum, identified by                            appendiceal orifice and ileocecal valve. The                            ileocecal valve, appendiceal orifice, and rectum                            were photographed. The quality of the bowel                            preparation was excellent. The colonoscopy was                            performed without difficulty. The patient tolerated                            the procedure well. The bowel preparation used was                            SUPREP via split dose instruction. Scope In: 9:16:12 AM Scope Out: 9:32:55 AM Scope Withdrawal Time: 0 hours 12 minutes 4 seconds  Total Procedure Duration: 0 hours 16 minutes 43 seconds  Findings:                 Three polyps were found in the sigmoid colon and                            ascending colon. The polyps were 3 to 4 mm in size.                            These polyps were removed with a cold snare.                            Resection and retrieval  were complete.                           A diffuse area of severe melanosis was found in the                            entire colon. Biopsies were taken with a cold                            forceps for histology.                           The exam was otherwise without abnormality on                            direct and retroflexion views. Complications:            No immediate complications. Estimated blood loss:                            None. Estimated Blood Loss:     Estimated blood loss: none. Impression:               - Three 3 to 4 mm polyps in the sigmoid colon and                            in the ascending colon, removed with a cold snare.                            Resected and retrieved.                           - Melanosis in the colon. Biopsied.                           - The examination was otherwise normal on direct                            and retroflexion  views. Recommendation:           - Repeat colonoscopy in 5 years for surveillance,                            if polyps adenomatous.                           - Patient has a contact number available for                            emergencies. The signs and symptoms of potential                            delayed complications were discussed with the                            patient. Return to normal activities tomorrow.  Written discharge instructions were provided to the                            patient.                           - Resume previous diet.                           - Continue present medications.                           - Await pathology results. Docia Chuck. Henrene Pastor, MD 08/26/2020 9:41:37 AM This report has been signed electronically.

## 2020-08-26 NOTE — Patient Instructions (Signed)
Handout given for polyps.  Await pathology results.  YOU HAD AN ENDOSCOPIC PROCEDURE TODAY AT [facility]:   Refer to the procedure report that was given to you for any specific questions about what was found during the examination.  If the procedure report does not answer your questions, please call your gastroenterologist to clarify.  If you requested that your care partner not be given the details of your procedure findings, then the procedure report has been included in a sealed envelope for you to review at your convenience later.  YOU SHOULD EXPECT: Some feelings of bloating in the abdomen. Passage of more gas than usual.  Walking can help get rid of the air that was put into your GI tract during the procedure and reduce the bloating. If you had a lower endoscopy (such as a colonoscopy or flexible sigmoidoscopy) you may notice spotting of blood in your stool or on the toilet paper. If you underwent a bowel prep for your procedure, you may not have a normal bowel movement for a few days.  Please Note:  You might notice some irritation and congestion in your nose or some drainage.  This is from the oxygen used during your procedure.  There is no need for concern and it should clear up in a day or so.  SYMPTOMS TO REPORT IMMEDIATELY:  Following lower endoscopy (colonoscopy or flexible sigmoidoscopy):  Excessive amounts of blood in the stool  Significant tenderness or worsening of abdominal pains  Swelling of the abdomen that is new, acute  Fever of 100F or higher  For urgent or emergent issues, a gastroenterologist can be reached at any hour by calling [phone]. Do not use MyChart messaging for urgent concerns.    DIET:  We do recommend a small meal at first, but then you may proceed to your regular diet.  Drink plenty of fluids but you should avoid alcoholic beverages for 24 hours.  ACTIVITY:  You should plan to take it easy for the rest of today and you should NOT  DRIVE or use heavy machinery until tomorrow (because of the sedation medicines used during the test).    FOLLOW UP: Our staff will call the number listed on your records 48-72 hours following your procedure to check on you and address any questions or concerns that you may have regarding the information given to you following your procedure. If we do not reach you, we will leave a message.  We will attempt to reach you two times.  During this call, we will ask if you have developed any symptoms of COVID 19. If you develop any symptoms (ie: fever, flu-like symptoms, shortness of breath, cough etc.) before then, please call [phone].  If you test positive for Covid 19 in the 2 weeks post procedure, please call and report this information to [place].    If any biopsies were taken you will be contacted by phone or by letter within the next 1-3 weeks.  Please call us at [phone] if you have not heard about the biopsies in 3 weeks.    SIGNATURES/CONFIDENTIALITY: You and/or your care partner have signed paperwork which will be entered into your electronic medical record.  These signatures attest to the fact that that the information above on your After Visit Summary has been reviewed and is understood.  Full responsibility of the confidentiality of this discharge information lies with you and/or your care-partner.

## 2020-08-26 NOTE — Progress Notes (Signed)
pt tolerated well. VSS. awake and to recovery. Report given to RN.  

## 2020-08-26 NOTE — Progress Notes (Signed)
Called to room to assist during endoscopic procedure.  Patient ID and intended procedure confirmed with present staff. Received instructions for my participation in the procedure from the performing physician.  

## 2020-08-28 ENCOUNTER — Encounter: Payer: Self-pay | Admitting: Internal Medicine

## 2020-08-28 ENCOUNTER — Telehealth: Payer: Self-pay

## 2020-08-28 NOTE — Telephone Encounter (Signed)
First post procedure follow up call, no answer 

## 2020-08-28 NOTE — Telephone Encounter (Signed)
Inbound call from patient. States he is doing well post procedure

## 2020-08-28 NOTE — Telephone Encounter (Signed)
Patient called back and said he was doing well post procedure. No complaints

## 2020-11-12 NOTE — Telephone Encounter (Signed)
error 

## 2021-03-24 IMAGING — CR DG CHEST 2V
2 series · 2 of 2 positions shown · non-contrast
Comparison: None.

CLINICAL DATA: Exit physical exam.

EXAM:
CHEST - 2 VIEW

[w chest pa]
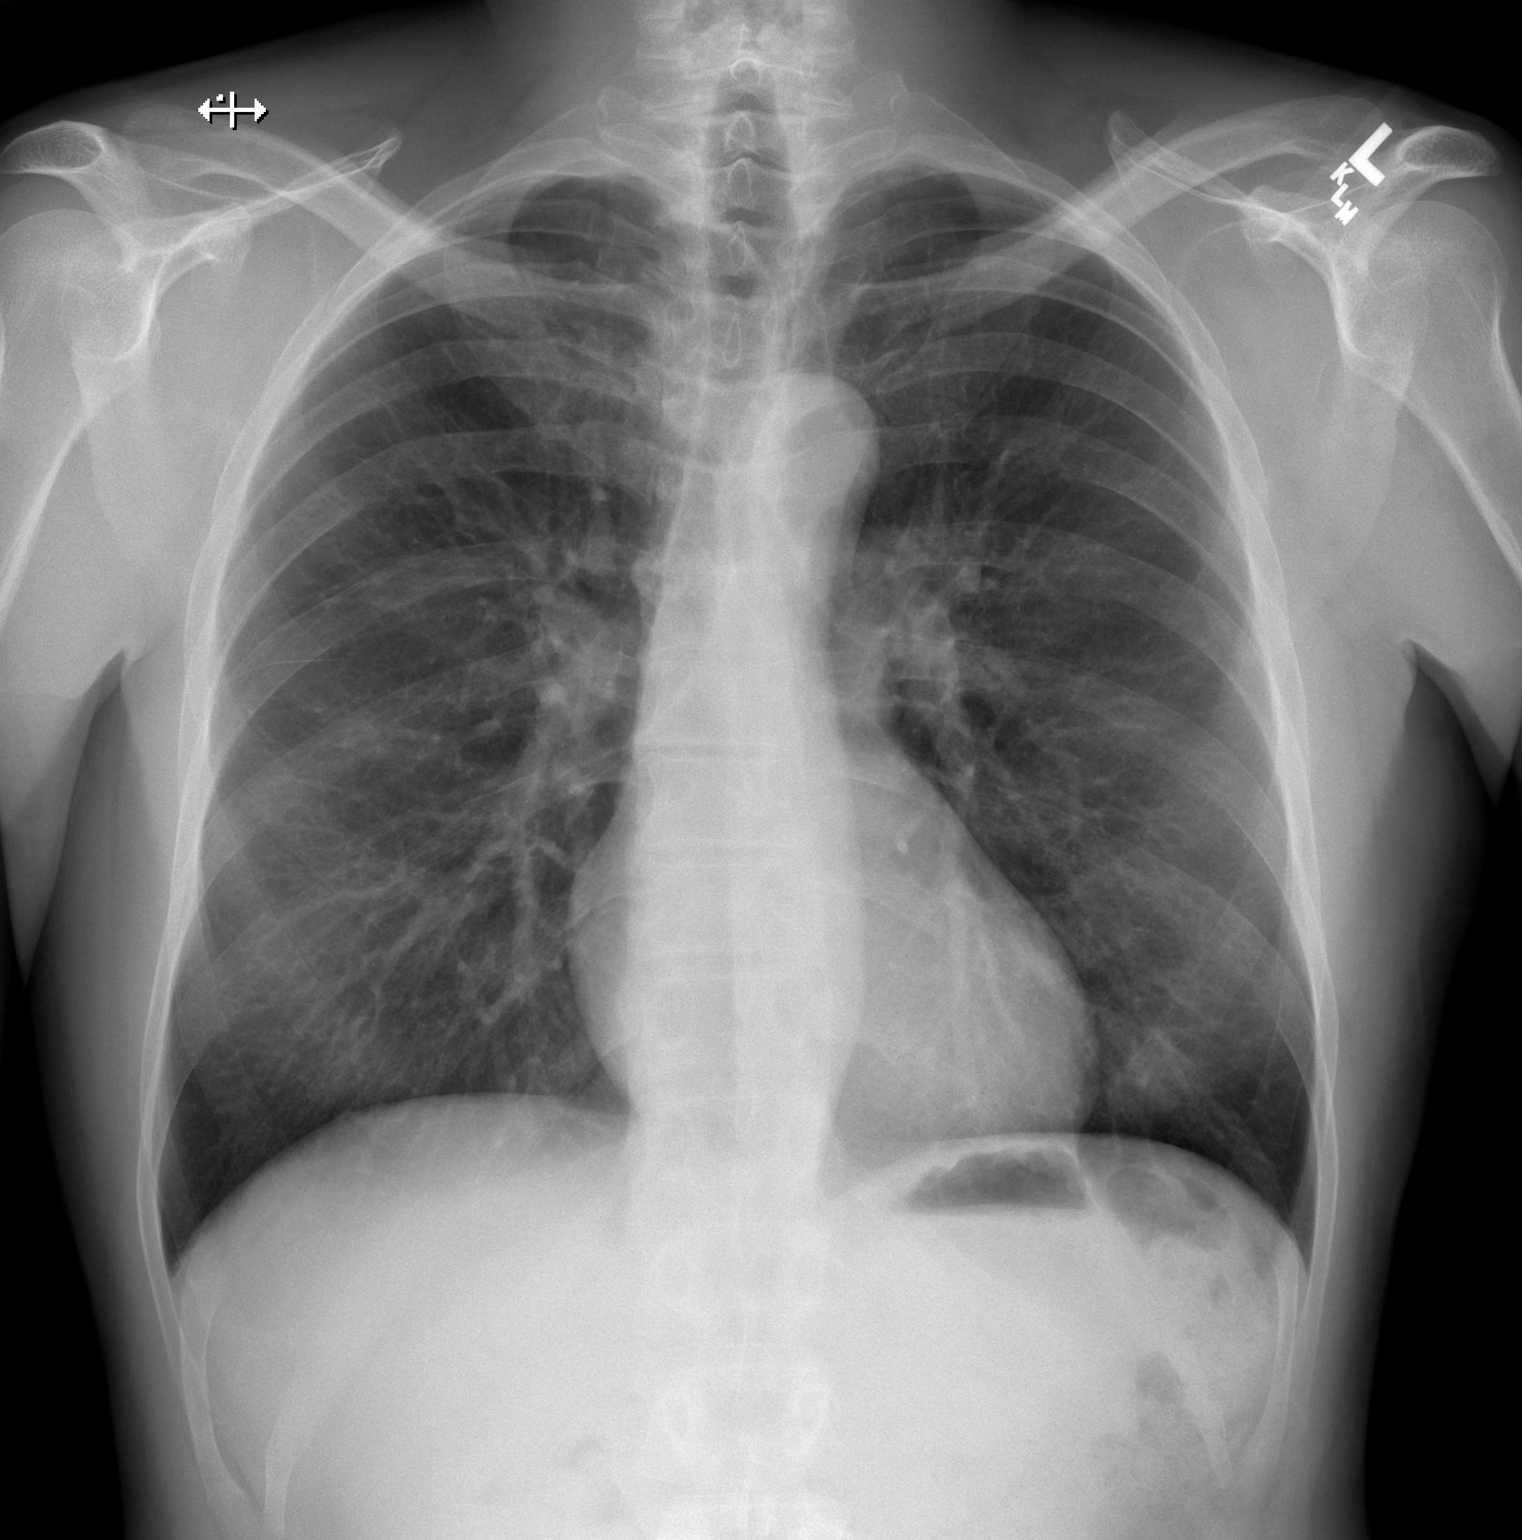

[w chest lat]
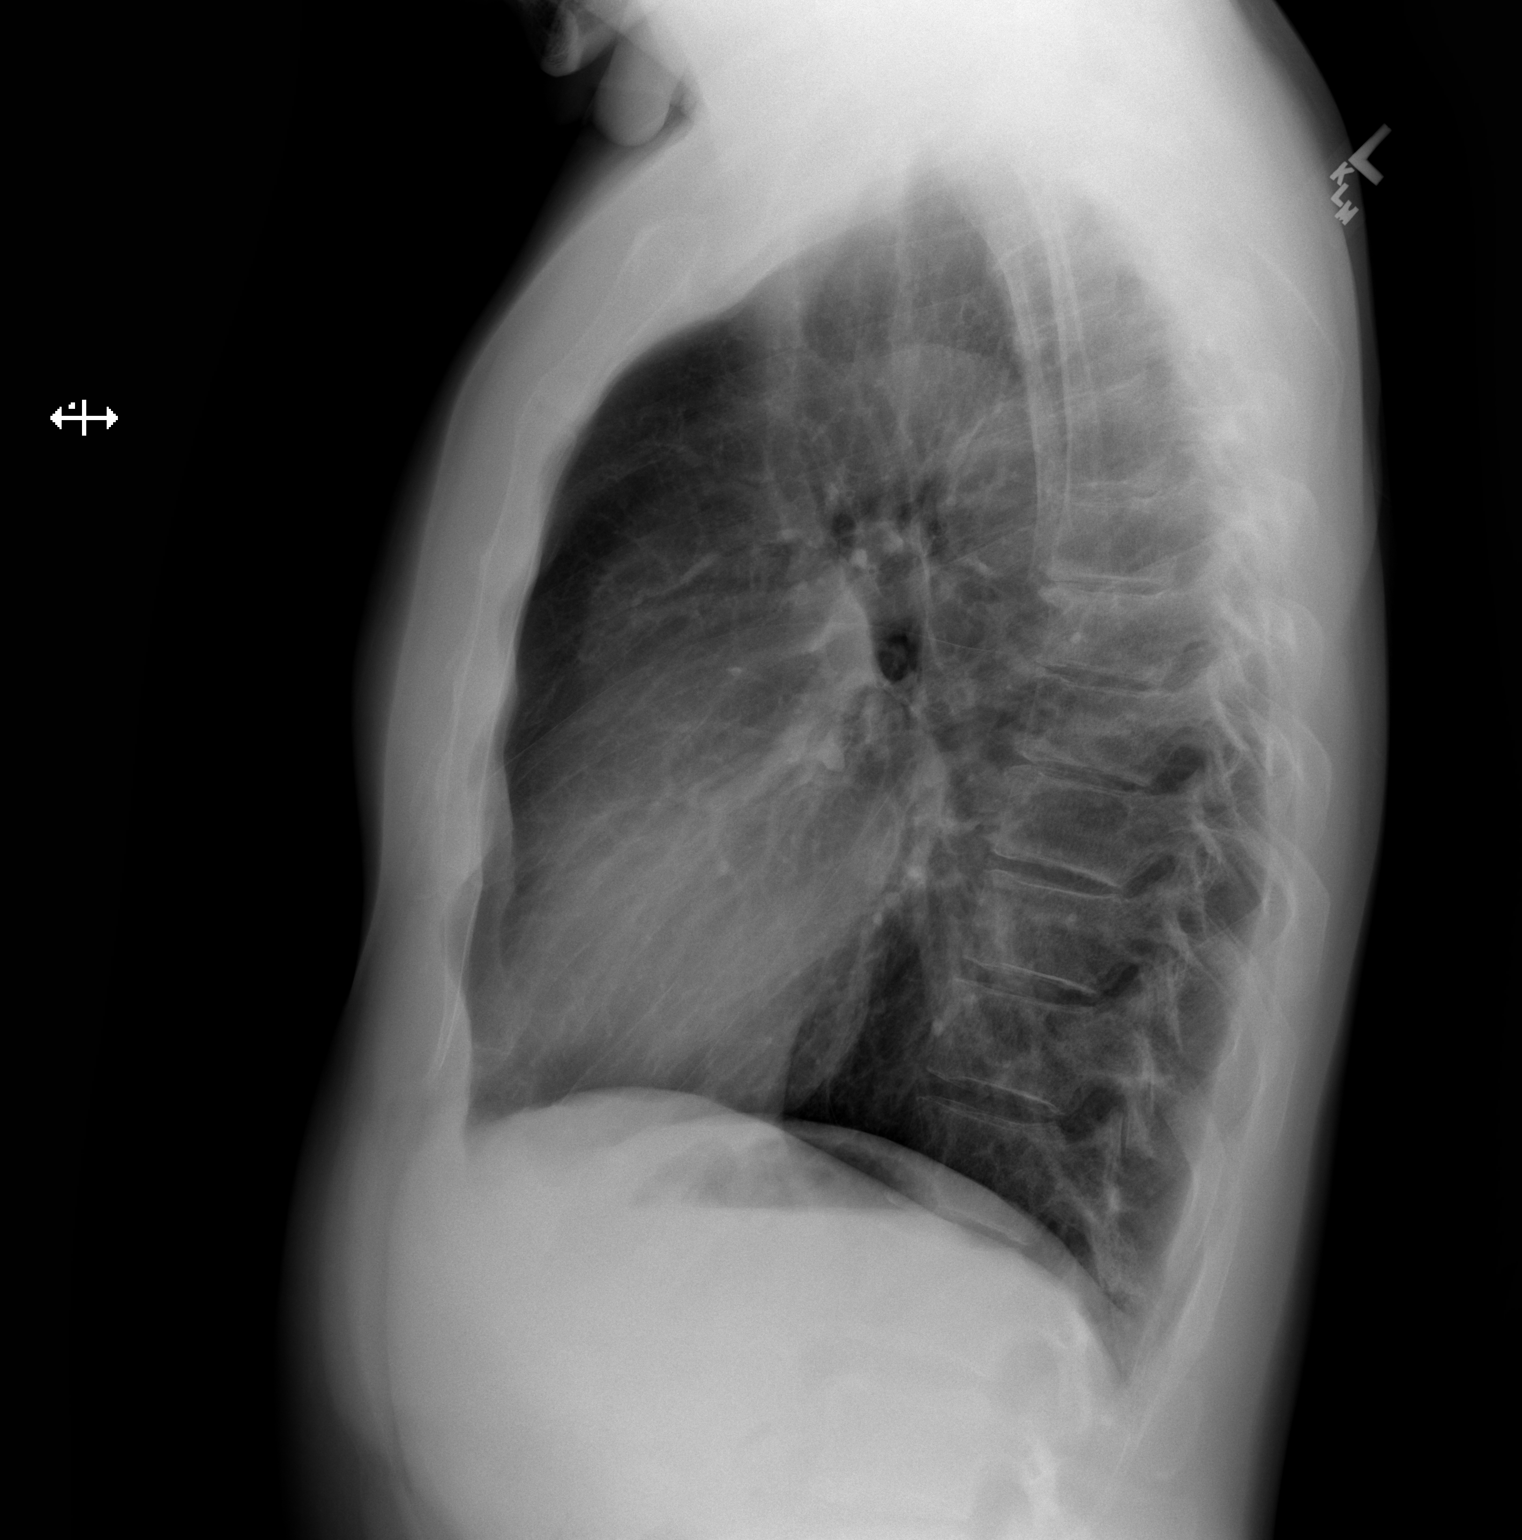

[2 of 2 positions shown; findings below may reference images not displayed]

FINDINGS: The heart size and mediastinal contours are within normal limits.
Both lungs are clear. The visualized skeletal structures are
unremarkable.
IMPRESSION: Normal exam.

## 2023-06-23 DIAGNOSIS — Z13220 Encounter for screening for lipoid disorders: Secondary | ICD-10-CM | POA: Diagnosis not present

## 2023-06-23 DIAGNOSIS — I480 Paroxysmal atrial fibrillation: Secondary | ICD-10-CM | POA: Diagnosis not present

## 2023-06-23 DIAGNOSIS — R0609 Other forms of dyspnea: Secondary | ICD-10-CM | POA: Diagnosis not present

## 2023-06-23 DIAGNOSIS — J3089 Other allergic rhinitis: Secondary | ICD-10-CM | POA: Diagnosis not present

## 2023-06-23 DIAGNOSIS — Z125 Encounter for screening for malignant neoplasm of prostate: Secondary | ICD-10-CM | POA: Diagnosis not present

## 2023-06-23 DIAGNOSIS — R399 Unspecified symptoms and signs involving the genitourinary system: Secondary | ICD-10-CM | POA: Diagnosis not present

## 2023-06-23 DIAGNOSIS — D72819 Decreased white blood cell count, unspecified: Secondary | ICD-10-CM | POA: Diagnosis not present

## 2023-06-23 DIAGNOSIS — J302 Other seasonal allergic rhinitis: Secondary | ICD-10-CM | POA: Diagnosis not present

## 2023-06-27 DIAGNOSIS — Z91030 Bee allergy status: Secondary | ICD-10-CM | POA: Diagnosis not present

## 2023-06-27 DIAGNOSIS — R0609 Other forms of dyspnea: Secondary | ICD-10-CM | POA: Diagnosis not present

## 2023-06-27 DIAGNOSIS — R001 Bradycardia, unspecified: Secondary | ICD-10-CM | POA: Diagnosis not present

## 2023-06-27 DIAGNOSIS — D72819 Decreased white blood cell count, unspecified: Secondary | ICD-10-CM | POA: Diagnosis not present

## 2023-06-27 DIAGNOSIS — J3089 Other allergic rhinitis: Secondary | ICD-10-CM | POA: Diagnosis not present

## 2023-06-27 DIAGNOSIS — Z136 Encounter for screening for cardiovascular disorders: Secondary | ICD-10-CM | POA: Diagnosis not present

## 2023-06-27 DIAGNOSIS — Z125 Encounter for screening for malignant neoplasm of prostate: Secondary | ICD-10-CM | POA: Diagnosis not present

## 2023-06-27 DIAGNOSIS — J302 Other seasonal allergic rhinitis: Secondary | ICD-10-CM | POA: Diagnosis not present

## 2023-06-27 DIAGNOSIS — I480 Paroxysmal atrial fibrillation: Secondary | ICD-10-CM | POA: Diagnosis not present

## 2023-06-27 DIAGNOSIS — R399 Unspecified symptoms and signs involving the genitourinary system: Secondary | ICD-10-CM | POA: Diagnosis not present

## 2023-09-12 DIAGNOSIS — N342 Other urethritis: Secondary | ICD-10-CM | POA: Diagnosis not present

## 2023-09-12 DIAGNOSIS — D72819 Decreased white blood cell count, unspecified: Secondary | ICD-10-CM | POA: Diagnosis not present

## 2023-09-12 DIAGNOSIS — R0609 Other forms of dyspnea: Secondary | ICD-10-CM | POA: Diagnosis not present

## 2023-09-12 DIAGNOSIS — I480 Paroxysmal atrial fibrillation: Secondary | ICD-10-CM | POA: Diagnosis not present

## 2023-09-12 DIAGNOSIS — Z136 Encounter for screening for cardiovascular disorders: Secondary | ICD-10-CM | POA: Diagnosis not present

## 2023-09-12 DIAGNOSIS — Z1159 Encounter for screening for other viral diseases: Secondary | ICD-10-CM | POA: Diagnosis not present

## 2023-09-12 DIAGNOSIS — Z1211 Encounter for screening for malignant neoplasm of colon: Secondary | ICD-10-CM | POA: Diagnosis not present

## 2023-09-12 DIAGNOSIS — N401 Enlarged prostate with lower urinary tract symptoms: Secondary | ICD-10-CM | POA: Diagnosis not present

## 2023-09-12 DIAGNOSIS — Z0001 Encounter for general adult medical examination with abnormal findings: Secondary | ICD-10-CM | POA: Diagnosis not present

## 2023-09-12 DIAGNOSIS — Z23 Encounter for immunization: Secondary | ICD-10-CM | POA: Diagnosis not present

## 2023-09-12 DIAGNOSIS — R001 Bradycardia, unspecified: Secondary | ICD-10-CM | POA: Diagnosis not present

## 2023-09-12 DIAGNOSIS — Z125 Encounter for screening for malignant neoplasm of prostate: Secondary | ICD-10-CM | POA: Diagnosis not present

## 2023-10-26 ENCOUNTER — Ambulatory Visit: Payer: Self-pay | Admitting: Cardiology

## 2023-11-03 DIAGNOSIS — Z23 Encounter for immunization: Secondary | ICD-10-CM | POA: Diagnosis not present
